# Patient Record
Sex: Female | Born: 1985 | Race: Black or African American | Hispanic: No | Marital: Married | Smoking: Never smoker
Health system: Southern US, Community
[De-identification: ages and names within clinical notes are randomized; demographics above are authoritative.]

## PROBLEM LIST (undated history)

## (undated) DIAGNOSIS — R569 Unspecified convulsions: Secondary | ICD-10-CM

---

## 2017-09-14 ENCOUNTER — Other Ambulatory Visit: Payer: Self-pay | Admitting: Neurology

## 2017-09-14 DIAGNOSIS — R569 Unspecified convulsions: Secondary | ICD-10-CM

## 2017-09-22 ENCOUNTER — Encounter (INDEPENDENT_AMBULATORY_CARE_PROVIDER_SITE_OTHER): Payer: Self-pay

## 2017-09-22 ENCOUNTER — Ambulatory Visit
Admission: RE | Admit: 2017-09-22 | Discharge: 2017-09-22 | Disposition: A | Discharge status: Home / Self Care | Payer: 59 | Source: Ambulatory Visit | Attending: Neurology | Admitting: Neurology

## 2017-09-22 DIAGNOSIS — R569 Unspecified convulsions: Secondary | ICD-10-CM | POA: Insufficient documentation

## 2017-10-03 ENCOUNTER — Encounter: Payer: Self-pay | Admitting: Emergency Medicine

## 2017-10-03 ENCOUNTER — Emergency Department
Admission: EM | Admit: 2017-10-03 | Discharge: 2017-10-03 | Disposition: A | Discharge status: Home / Self Care | Payer: 59 | Attending: Emergency Medicine | Admitting: Emergency Medicine

## 2017-10-03 DIAGNOSIS — R569 Unspecified convulsions: Secondary | ICD-10-CM | POA: Insufficient documentation

## 2017-10-03 DIAGNOSIS — R55 Syncope and collapse: Secondary | ICD-10-CM | POA: Diagnosis present

## 2017-10-03 HISTORY — DX: Unspecified convulsions: R56.9

## 2017-10-03 LAB — URINALYSIS, COMPLETE (UACMP) WITH MICROSCOPIC
BILIRUBIN URINE: NEGATIVE
Bacteria, UA: NONE SEEN
GLUCOSE, UA: NEGATIVE mg/dL
HGB URINE DIPSTICK: NEGATIVE
KETONES UR: NEGATIVE mg/dL
LEUKOCYTES UA: NEGATIVE
NITRITE: NEGATIVE
Protein, ur: NEGATIVE mg/dL
Specific Gravity, Urine: 1.014 (ref 1.005–1.030)
pH: 6 (ref 5.0–8.0)

## 2017-10-03 LAB — BASIC METABOLIC PANEL
ANION GAP: 8 (ref 5–15)
BUN: 12 mg/dL (ref 6–20)
CALCIUM: 8.4 mg/dL — AB (ref 8.9–10.3)
CO2: 22 mmol/L (ref 22–32)
CREATININE: 0.8 mg/dL (ref 0.44–1.00)
Chloride: 103 mmol/L (ref 98–111)
GFR calc non Af Amer: >60 mL/min (ref >60)
Glucose, Bld: 142 mg/dL — ABNORMAL HIGH (ref 70–99)
Potassium: 3.3 mmol/L — ABNORMAL LOW (ref 3.5–5.1)
SODIUM: 133 mmol/L — AB (ref 135–145)

## 2017-10-03 LAB — CBC
HCT: 35.8 % (ref 35.0–47.0)
HEMOGLOBIN: 12.5 g/dL (ref 12.0–16.0)
MCH: 30.7 pg (ref 26.0–34.0)
MCHC: 35 g/dL (ref 32.0–36.0)
MCV: 87.6 fL (ref 80.0–100.0)
Platelets: 281 10*3/uL (ref 150–440)
RBC: 4.08 MIL/uL (ref 3.80–5.20)
RDW: 12.8 % (ref 11.5–14.5)
WBC: 8.3 10*3/uL (ref 3.6–11.0)

## 2017-10-03 LAB — PREGNANCY, URINE: Preg Test, Ur: NEGATIVE

## 2017-10-03 LAB — VALPROIC ACID LEVEL: Valproic Acid Lvl: <10 ug/mL — ABNORMAL LOW (ref 50.0–100.0)

## 2017-10-03 MED ORDER — LORAZEPAM 2 MG/ML IJ SOLN
INTRAMUSCULAR | Status: AC
  Filled 2017-10-03: qty 1

## 2017-10-03 MED ORDER — VALPROATE SODIUM 500 MG/5ML IV SOLN
500.0000 mg | Freq: Once | INTRAVENOUS | Status: AC
  Administered 2017-10-03: 500 mg via INTRAVENOUS
  Filled 2017-10-03: qty 5

## 2017-10-03 MED ORDER — ACETAMINOPHEN 325 MG PO TABS
650.0000 mg | ORAL_TABLET | Freq: Once | ORAL | Status: AC
  Administered 2017-10-03: 650 mg via ORAL
  Filled 2017-10-03: qty 2

## 2017-10-03 MED ORDER — LORAZEPAM 2 MG/ML IJ SOLN
1.0000 mg | Freq: Once | INTRAMUSCULAR | Status: AC
  Administered 2017-10-03: 1 mg via INTRAVENOUS

## 2017-10-03 NOTE — ED Notes (Signed)
Resumed care from Melissa Booth, Charity fundraiserN. Pt sleeping with family at bedside while depakote infusing. NAD noted.

## 2017-10-03 NOTE — ED Notes (Signed)
Lab called to add on urine preg and val acid level at this time

## 2017-10-03 NOTE — ED Notes (Signed)
Patient reports that when she went to the bathroom, she noted a small amount of vaginal bleeding.  Will continue to monitor.

## 2017-10-03 NOTE — ED Notes (Signed)
Pt discharged home after verbalizing understanding of discharge instructions; nad noted. 

## 2017-10-03 NOTE — ED Provider Notes (Addendum)
[facility] Emergency Department Provider Note  ____________________________________________   I have reviewed the triage vital signs and the nursing notes. Where available I have reviewed prior notes and, if possible and indicated, outside hospital notes.    HISTORY  Chief Complaint Loss of Consciousness    HPI Melissa Booth is a 32 y.o. female routine here history of seizures, has frequent seizures, has seizures of varying types, sometimes she passes out sometimes she just has tremors, they do seem to be worse when she is near her menstrual.  Patient is taking Lamictal and she is taking Depakote, she states she has not missed any doses.  She had a sensation of tremulousness throughout her entire body which seemed a little bit atypical may she cannot describe and then passed out.  She did slightly bite her tongue.  No active bleeding.  No other trauma.  She does not feel she injured herself.  She does not feel that she hit her head.  She has a slight residual headache which is the usual headache she always has.  She denies pregnancy.  She has no other complaints.  Patient understands that she must not drive etc. already.    Past Medical History:  Diagnosis Date  . Seizures (HCC)     There are no active problems to display for this patient.   History reviewed. No pertinent surgical history.  Prior to Admission medications   Not on File    Allergies Patient has no known allergies.  No family history on file.  Social History Social History   Tobacco Use  . Smoking status: Never Smoker  . Smokeless tobacco: Never Used  Substance Use Topics  . Alcohol use: Yes    Comment: occasionally  . Drug use: Not on file    Review of Systems Constitutional: No fever/chills Eyes: No visual changes. ENT: No sore throat. No stiff neck no neck pain Cardiovascular: Denies chest pain. Respiratory: Denies shortness of breath. Gastrointestinal:   no vomiting.  No  diarrhea.  No constipation. Genitourinary: Negative for dysuria. Musculoskeletal: Negative lower extremity swelling Skin: Negative for rash. Neurological: Negative for severe headaches, focal weakness or numbness.   ____________________________________________   PHYSICAL EXAM:  VITAL SIGNS: ED Triage Vitals [10/03/17 1618]  Enc Vitals Group     BP 125/70     Pulse Rate (!) 113     Resp 18     Temp 99.5 F (37.5 C)     Temp Source Oral     SpO2 98 %     Weight 143 lb 4.8 oz (65 kg)     Height 5\' 3"  (1.6 m)     Head Circumference      Peak Flow      Pain Score 8     Pain Loc      Pain Edu?      Excl. in GC?     Constitutional: Alert and oriented. Well appearing and in no acute distress.,  Resting comfortably in the bed, talking on the phone.  Her husband is also in the room talking the phone and I did ask him to step out so I could conduct a interview with her without him talking Eyes: Conjunctivae are normal Head: Atraumatic HEENT: No congestion/rhinnorhea. Mucous membranes are moist.  Oropharynx non-erythematous, there is a very slight tongue bite to the right lateral tongue which is nonbleeding, Neck:   Nontender with no meningismus, no masses, no stridor Cardiovascular: Normal rate, regular rhythm. Grossly normal  heart sounds.  Good peripheral circulation. Respiratory: Normal respiratory effort.  No retractions. Lungs CTAB. Abdominal: Soft and nontender. No distention. No guarding no rebound Back:  There is no focal tenderness or step off.  there is no midline tenderness there are no lesions noted. there is no CVA tenderness Musculoskeletal: No lower extremity tenderness, no upper extremity tenderness. No joint effusions, no DVT signs strong distal pulses no edema Neurologic:  Normal speech and language. No gross focal neurologic deficits are appreciated.  Skin:  Skin is warm, dry and intact. No rash noted. Psychiatric: Mood and affect are A anxious. Speech and behavior  are normal.  ____________________________________________   LABS (all labs ordered are listed, but only abnormal results are displayed)  Labs Reviewed  BASIC METABOLIC PANEL - Abnormal; Notable for the following components:      Result Value   Sodium 133 (*)    Potassium 3.3 (*)    Glucose, Bld 142 (*)    Calcium 8.4 (*)    All other components within normal limits  URINALYSIS, COMPLETE (UACMP) WITH MICROSCOPIC - Abnormal; Notable for the following components:   Color, Urine YELLOW (*)    APPearance CLEAR (*)    All other components within normal limits  CBC  VALPROIC ACID LEVEL  PREGNANCY, URINE  CBG MONITORING, ED  POC URINE PREG, ED    Pertinent labs  results that were available during my care of the patient were reviewed by me and considered in my medical decision making (see chart for details). ____________________________________________  EKG  I personally interpreted any EKGs ordered by me or triage Sinus tach rate 103 no acute ST elevation or depression normal axis unremarkable EKG ____________________________________________  RADIOLOGY  Pertinent labs & imaging results that were available during my care of the patient were reviewed by me and considered in my medical decision making (see chart for details). If possible, patient and/or family made aware of any abnormal findings.  No results found. ____________________________________________    PROCEDURES  Procedure(s) performed: None  Procedures  Critical Care performed: None  ____________________________________________   INITIAL IMPRESSION / ASSESSMENT AND PLAN / ED COURSE  Pertinent labs & imaging results that were available during my care of the patient were reviewed by me and considered in my medical decision making (see chart for details).  Patient with a chronic known history of seizures presents after having had a seizure, she does state that she is compliant, she does have frequent seizures.   We will check a Depakote level.  I cannot check a Lamictal level.  If difficult level is low we will load her up.  Electro lites are reassuring, urine pregnancy is pending, at this time she is in no acute distress, we were Tylenol for slight headache, no evidence of head bleed, she is neurologically intact at this time, no evidence that she is in subacute or subclinical status.  We will have her follow closely with neurology.  Patient did have an MRI that was normal already this month.  ----------------------------------------- 7:05 PM on 10/03/2017 -----------------------------------------  Patient states that she has been taking her medications when asked several times however her Depakote level was undetectable.  Patient states she actually took herself off of Depakote because she wants to get pregnant.  She is not currently pregnant.  According to her they are trying to go down on her Depakote and up on her Lamictal.  They are therefore in a transition..  We will load her Depakote here as apparently  that transition is not working given her breakthrough seizure, patient remains awake alert with no evidence of ongoing seizure activity here.  In addition, she is not pregnant.  We also noted that her husband states that they usually manages with CBD oil, however they are out of it and cannot find a good supplier here and they are wondering if I know a good supply of CBD oil which is pharmaceutical grade.  I explained that I do not.  Perhaps his neurologist can help him with that.  Patient in no acute distress and is my hope after loading her Depakote we can get her safely home    ____________________________________________   FINAL CLINICAL IMPRESSION(S) / ED DIAGNOSES  Final diagnoses:  None      This chart was dictated using voice recognition software.  Despite best efforts to proofread,  errors can occur which can change meaning.       Jeanmarie Plant, MD 10/03/17 1708    Jeanmarie Plant, MD 10/03/17 [phone]

## 2017-10-03 NOTE — ED Notes (Signed)
See triage note: Pt reports seizure today. Did bite right side of tongue. C/o 10/10 headache at this time. Sensitivity to light. Takes lamotrigine and Depakote for seizures at home. Last seizure was in February.  Pt states LMP was 8/8. Reports some drops of blood on her underwear after that the seizure. States when she has seizures it's usually during menstrual period.

## 2017-10-03 NOTE — ED Triage Notes (Signed)
Patient presents to the ED after an episode of loss of consciousness.  Patient states she was at work, approx. pta when she began to shake and then lost consciousness.  Patient reports history of seizures and states this felt somewhat different from her normal seizures.  Patient states she has been taking her seizure medication reguarly.  Patient is in no obvious distress at this time.

## 2017-10-03 NOTE — Discharge Instructions (Addendum)
Continue taking Depakote as prescribed, do not drive, soaking the tub, climb ladders, or do anything else which, if interrupted by seizure, could cause you harm.  Follow-up with your neurologist tomorrow.  If you have recurrent or persistent seizures, or several seizures in a row without waking back up, return to the emergency room.

## 2019-05-22 ENCOUNTER — Ambulatory Visit: Payer: Self-pay | Attending: Internal Medicine

## 2019-05-22 DIAGNOSIS — Z23 Encounter for immunization: Secondary | ICD-10-CM

## 2019-05-22 NOTE — Progress Notes (Signed)
   Covid-[facility] Vaccination [facility]  Name:  Melissa Booth    MRN: [id number] DOB: 1986-01-19  05/22/2019  Melissa Booth was observed post Covid-19 immunization for 15 minutes without incident. She was provided with Vaccine Information Sheet and instruction to access the V-Safe system.   Melissa Booth was instructed to call 911 with any severe reactions post vaccine: Marland Kitchen Difficulty breathing  . Swelling of face and throat  . A fast heartbeat  . A bad rash all over body  . Dizziness and weakness   Immunizations Administered    Name Date Dose VIS Date Route   Moderna COVID-19 Vaccine 05/22/2019  4:47 PM 0.5 mL 01/16/2019 Intramuscular   Manufacturer: Moderna   Lot: [id number]   NDC: 36067-703-40

## 2019-06-19 ENCOUNTER — Ambulatory Visit: Payer: Self-pay

## 2019-11-19 ENCOUNTER — Other Ambulatory Visit: Payer: Self-pay | Admitting: Urology

## 2020-06-26 IMAGING — MR MR HEAD W/O CM
11 series · 48 of 48 positions shown · non-contrast
Comparison: None.

CLINICAL DATA: Seizures.

EXAM:
MRI HEAD WITHOUT CONTRAST
TECHNIQUE: Multiplanar, multiecho pulse sequences of the brain and surrounding
structures were obtained without intravenous contrast.

[Series 2: T1 · sagittal · 5.0mm · 0.45mm/px · 1 of 23 slices shown (1 of 2)]
[im 1/23]
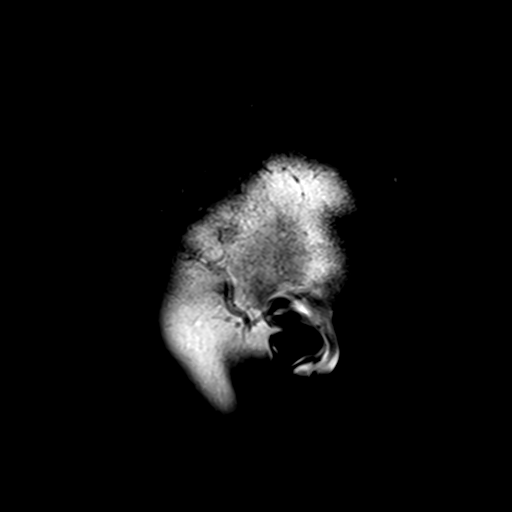

[Series 4: DWI · axial · 3.0mm · 1.20mm/px · z∈[-78,+77]mm · 5 of 55 slices shown (1 of 4)]
[im 1/55]
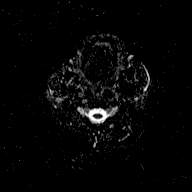
[im 14/55]
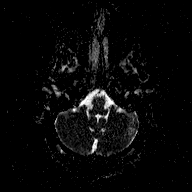
[im 28/55]
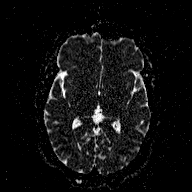
[im 41/55]
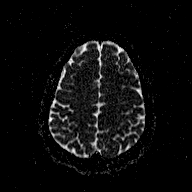
[im 55/55]
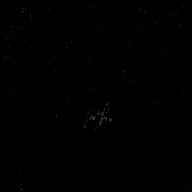

[Series 6: DWI · coronal · 3.0mm · 1.15mm/px · 4 of 46 slices shown (2 of 4)]
[im 1/46]
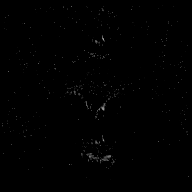
[im 16/46]
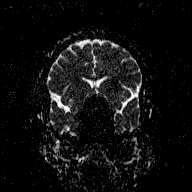
[im 31/46]
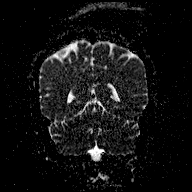
[im 46/46]
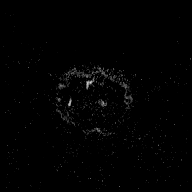

[Series 7: T2 · axial · 5.0mm · 0.72mm/px · z∈[-71,+77]mm · 2 of 23 slices shown (1 of 4)]
[im 1/23]
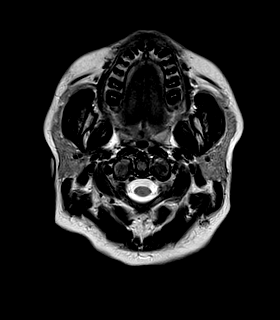
[im 23/23]
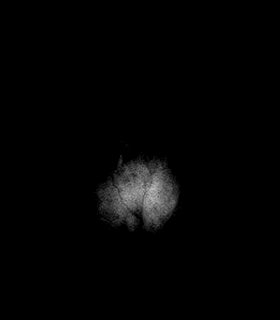

[Series 8: FLAIR · axial · 3.0mm · 0.45mm/px · z∈[-75,+81]mm · 5 of 55 slices shown]
[im 1/55]
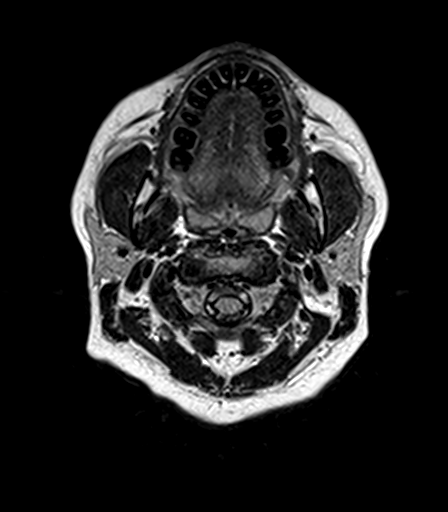
[im 14/55]
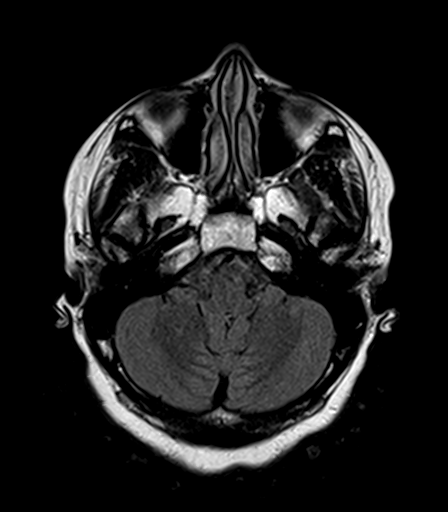
[im 28/55]
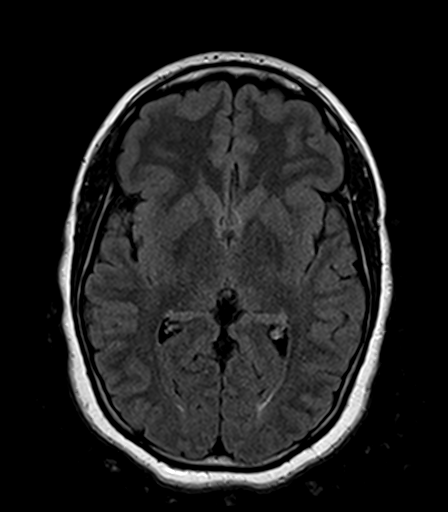
[im 41/55]
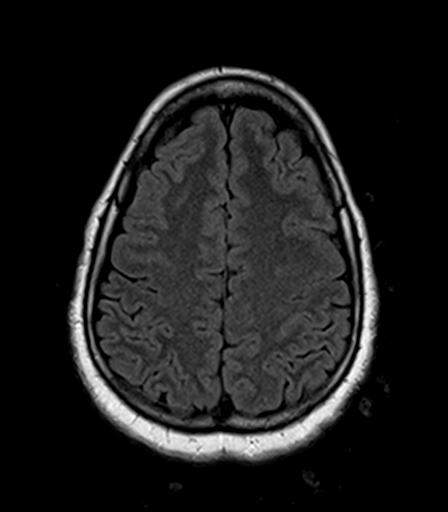
[im 55/55]
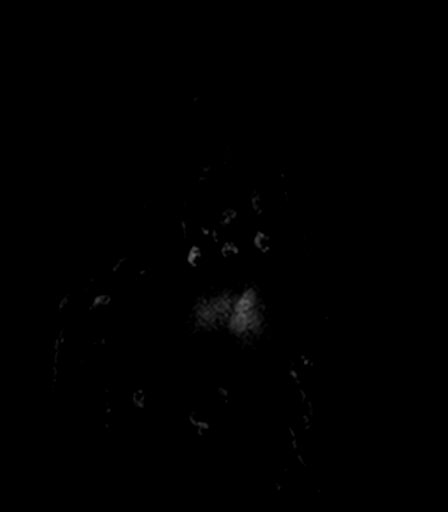

[Series 9: T2 · axial · 5.0mm · 0.72mm/px · z∈[-71,+77]mm · 2 of 23 slices shown (2 of 4)]
[im 1/23]
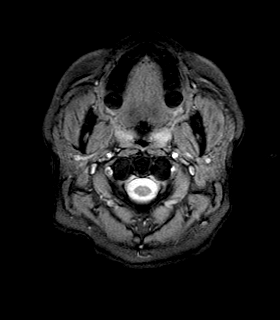
[im 23/23]
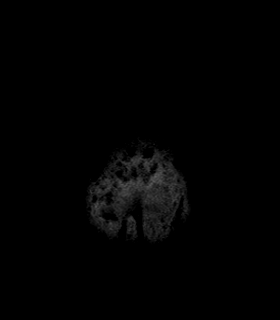

[Series 10: T1 · axial · 1.0mm · 1.00mm/px · z∈[-77,+76]mm · 14 of 160 slices shown (2 of 2)]
[im 1/160]
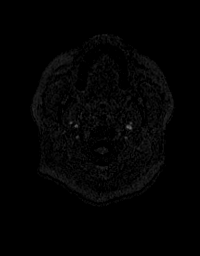
[im 13/160]
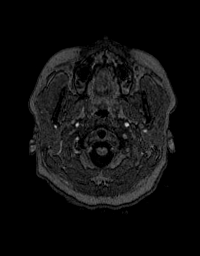
[im 25/160]
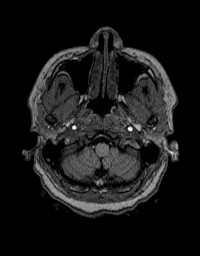
[im 37/160]
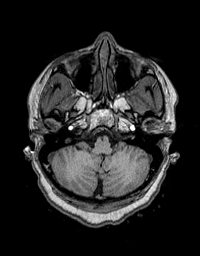
[im 49/160]
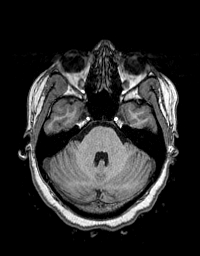
[im 62/160]
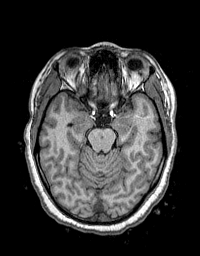
[im 74/160]
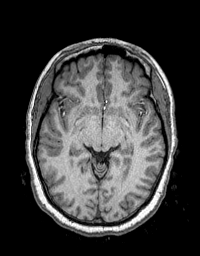
[im 86/160]
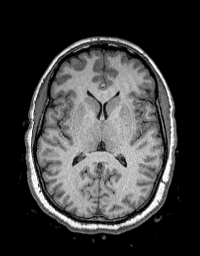
[im 98/160]
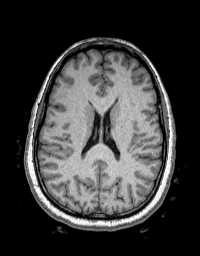
[im 111/160]
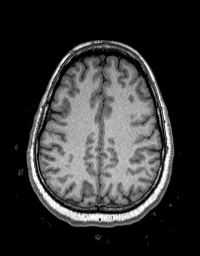
[im 123/160]
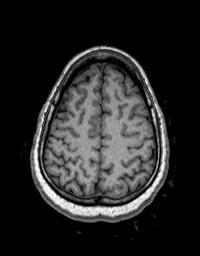
[im 135/160]
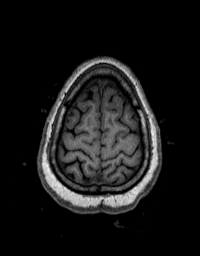
[im 147/160]
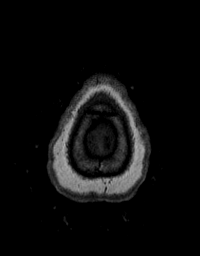
[im 160/160]
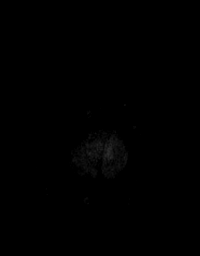

[Series 11: T2 · coronal · 5.0mm · 0.43mm/px · 3 of 29 slices shown (3 of 4)]
[im 1/29]
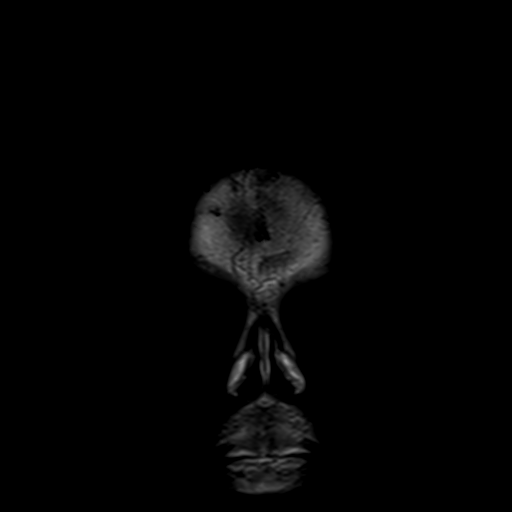
[im 15/29]
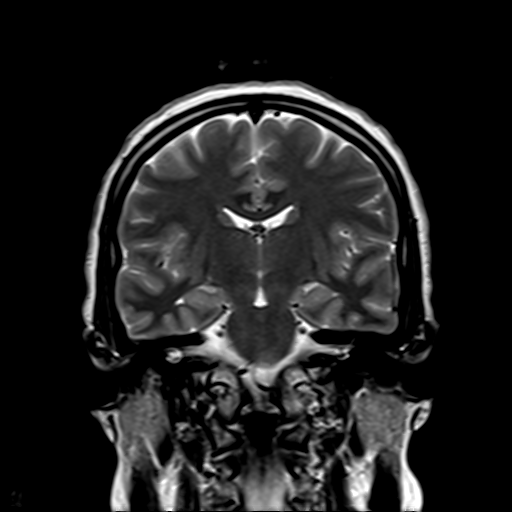
[im 29/29]
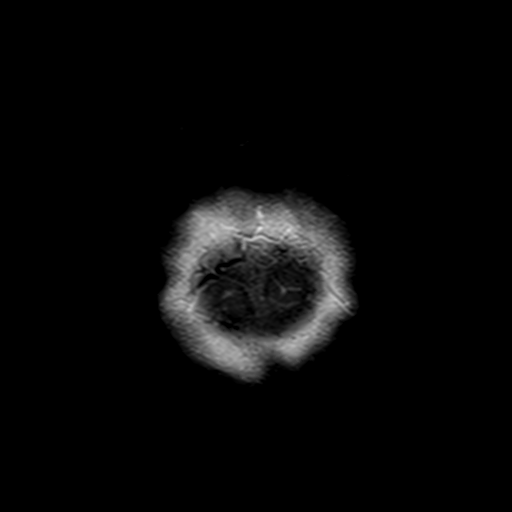

[Series 12: T2 · coronal · 3.0mm · 0.47mm/px · 3 of 29 slices shown (4 of 4)]
[im 1/29]
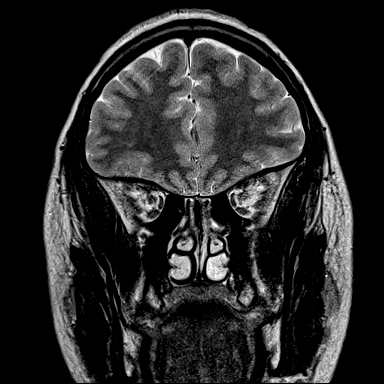
[im 15/29]
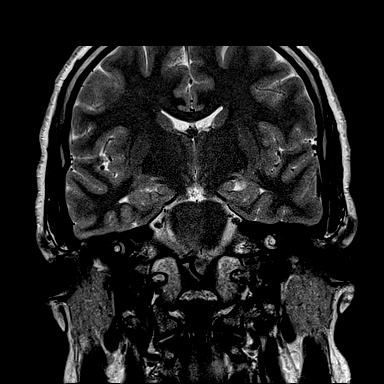
[im 29/29]
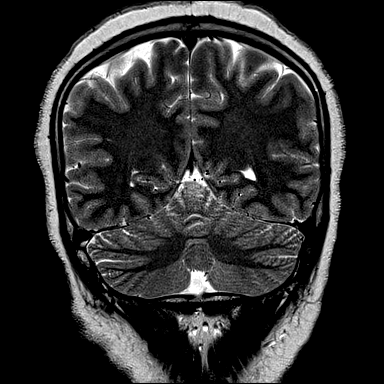

[Series 100: DWI · axial · 3.0mm · 1.20mm/px · z∈[-78,+77]mm · 5 of 55 slices shown (3 of 4)]
[im 1/55]
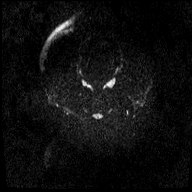
[im 14/55]
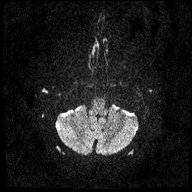
[im 28/55]
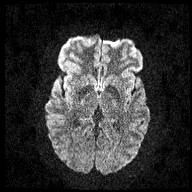
[im 41/55]
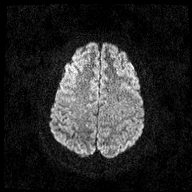
[im 55/55]
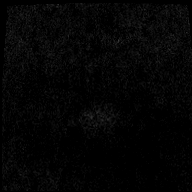

[Series 101: DWI · coronal · 3.0mm · 1.15mm/px · 4 of 47 slices shown (4 of 4)]
[im 1/47]
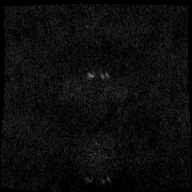
[im 16/47]
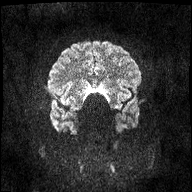
[im 31/47]
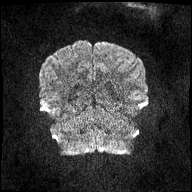
[im 47/47]
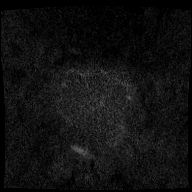

[48 of 48 positions shown; findings below may reference images not displayed]

FINDINGS: Brain: Dedicated thin section imaging through the temporal lobes
demonstrates normal volume and signal of the hippocampi. There is no
evidence of heterotopia or cortical dysplasia.

There is no evidence of acute infarct, intracranial hemorrhage,
mass, midline shift, or extra-axial fluid collection. The ventricles
and sulci are normal. The brain is normal in signal.

Vascular: Major intracranial vascular flow voids are preserved.

Skull and upper cervical spine: Unremarkable bone marrow signal.

Sinuses/Orbits: Unremarkable orbits. Paranasal sinuses and mastoid
air cells are clear.

Other: None.
IMPRESSION: Negative brain MRI.
